# Patient Record
Sex: Male | Born: 1990 | Race: White | Hispanic: No | Marital: Single | Smoking: Current every day smoker
Health system: Southern US, Community
[De-identification: ages and names within clinical notes are randomized; demographics above are authoritative.]

---
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
## 2011-11-15 ENCOUNTER — Emergency Department: Payer: Self-pay | Admitting: Emergency Medicine

## 2013-04-24 ENCOUNTER — Emergency Department (HOSPITAL_COMMUNITY)
Admission: EM | Admit: 2013-04-24 | Discharge: 2013-04-24 | Disposition: A | Discharge status: Home / Self Care | Payer: Self-pay | Attending: Emergency Medicine | Admitting: Emergency Medicine

## 2013-04-24 ENCOUNTER — Encounter (HOSPITAL_COMMUNITY): Payer: Self-pay | Admitting: Cardiology

## 2013-04-24 ENCOUNTER — Emergency Department (HOSPITAL_COMMUNITY): Payer: Self-pay

## 2013-04-24 DIAGNOSIS — F17200 Nicotine dependence, unspecified, uncomplicated: Secondary | ICD-10-CM | POA: Insufficient documentation

## 2013-04-24 DIAGNOSIS — Y92009 Unspecified place in unspecified non-institutional (private) residence as the place of occurrence of the external cause: Secondary | ICD-10-CM | POA: Insufficient documentation

## 2013-04-24 DIAGNOSIS — S60222A Contusion of left hand, initial encounter: Secondary | ICD-10-CM

## 2013-04-24 DIAGNOSIS — S60229A Contusion of unspecified hand, initial encounter: Secondary | ICD-10-CM | POA: Insufficient documentation

## 2013-04-24 DIAGNOSIS — Y9389 Activity, other specified: Secondary | ICD-10-CM | POA: Insufficient documentation

## 2013-04-24 DIAGNOSIS — W2209XA Striking against other stationary object, initial encounter: Secondary | ICD-10-CM | POA: Insufficient documentation

## 2013-04-24 DIAGNOSIS — S60221A Contusion of right hand, initial encounter: Secondary | ICD-10-CM

## 2013-04-24 MED ORDER — HYDROCODONE-ACETAMINOPHEN 5-325 MG PO TABS
1.0000 | ORAL_TABLET | Freq: Once | ORAL | Status: AC
  Administered 2013-04-24: 1 via ORAL
  Filled 2013-04-24: qty 1

## 2013-04-24 MED ORDER — IBUPROFEN 400 MG PO TABS
800.0000 mg | ORAL_TABLET | Freq: Once | ORAL | Status: AC
  Administered 2013-04-24: 800 mg via ORAL
  Filled 2013-04-24: qty 2

## 2013-04-24 MED ORDER — HYDROCODONE-ACETAMINOPHEN 5-325 MG PO TABS: 1.0000 | ORAL_TABLET | ORAL | Status: DC | PRN

## 2013-04-24 MED ORDER — IBUPROFEN 600 MG PO TABS: 600.0000 mg | ORAL_TABLET | Freq: Four times a day (QID) | ORAL | Status: DC | PRN
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
## 2013-04-24 NOTE — ED Notes (Signed)
PT ambulated with baseline gait; VSS; A&Ox3; no signs of distress; respirations even and unlabored; skin warm and dry; no questions upon discharge.  

## 2013-04-24 NOTE — ED Notes (Signed)
Pt eatting Doritos in the waiting room. PT instructed to stop eatting and not to drink anything at this time.

## 2013-04-24 NOTE — ED Notes (Signed)
Patient transported to X-ray 

## 2013-04-24 NOTE — ED Provider Notes (Signed)
Medical screening examination/treatment/procedure(s) were performed by non-physician practitioner and as supervising physician I was immediately available for consultation/collaboration.   Charles B. Sheldon, MD 04/24/13 1524 

## 2013-04-24 NOTE — ED Notes (Signed)
Pt returned from xray
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
## 2013-05-26 ENCOUNTER — Emergency Department: Payer: Self-pay | Admitting: Emergency Medicine

## 2018-03-21 ENCOUNTER — Emergency Department
Admission: EM | Admit: 2018-03-21 | Discharge: 2018-03-21 | Disposition: A | Discharge status: Home / Self Care | Payer: Self-pay | Attending: Emergency Medicine | Admitting: Emergency Medicine

## 2018-03-21 ENCOUNTER — Encounter: Payer: Self-pay | Admitting: Emergency Medicine

## 2018-03-21 ENCOUNTER — Emergency Department: Payer: Self-pay

## 2018-03-21 ENCOUNTER — Other Ambulatory Visit: Payer: Self-pay

## 2018-03-21 DIAGNOSIS — M79671 Pain in right foot: Secondary | ICD-10-CM | POA: Insufficient documentation

## 2018-03-21 DIAGNOSIS — F17210 Nicotine dependence, cigarettes, uncomplicated: Secondary | ICD-10-CM | POA: Insufficient documentation

## 2018-03-21 MED ORDER — MELOXICAM 15 MG PO TABS: 15.0000 mg | ORAL_TABLET | Freq: Every day | ORAL | 1 refills | Status: AC
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
## 2018-05-05 ENCOUNTER — Encounter: Payer: Self-pay | Admitting: Emergency Medicine

## 2018-05-05 ENCOUNTER — Other Ambulatory Visit: Payer: Self-pay

## 2018-05-05 ENCOUNTER — Emergency Department
Admission: EM | Admit: 2018-05-05 | Discharge: 2018-05-05 | Discharge status: Against Medical Advice | Payer: Self-pay | Attending: Emergency Medicine | Admitting: Emergency Medicine

## 2018-05-05 DIAGNOSIS — R2681 Unsteadiness on feet: Secondary | ICD-10-CM | POA: Insufficient documentation

## 2018-05-05 DIAGNOSIS — Z5321 Procedure and treatment not carried out due to patient leaving prior to being seen by health care provider: Secondary | ICD-10-CM | POA: Insufficient documentation

## 2018-05-05 DIAGNOSIS — Z0283 Encounter for blood-alcohol and blood-drug test: Secondary | ICD-10-CM | POA: Insufficient documentation
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
## 2018-05-05 NOTE — ED Notes (Signed)
Patient refusing to go to a bed in hallway to await ride.  Explained to patient concern for his safety if he is in lobby or walks outside.  Patient continues to refuse offer of bed to lay down in.
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
## 2018-05-05 NOTE — ED Notes (Signed)
Informed by charge nurse that patient had been picked up by his mother.

## 2018-08-25 ENCOUNTER — Other Ambulatory Visit: Payer: Self-pay

## 2018-08-25 ENCOUNTER — Emergency Department
Admission: EM | Admit: 2018-08-25 | Discharge: 2018-08-25 | Disposition: A | Discharge status: Home / Self Care | Payer: Self-pay | Attending: Emergency Medicine | Admitting: Emergency Medicine

## 2018-08-25 ENCOUNTER — Encounter: Payer: Self-pay | Admitting: Emergency Medicine

## 2018-08-25 DIAGNOSIS — R197 Diarrhea, unspecified: Secondary | ICD-10-CM | POA: Insufficient documentation

## 2018-08-25 DIAGNOSIS — F17210 Nicotine dependence, cigarettes, uncomplicated: Secondary | ICD-10-CM | POA: Insufficient documentation

## 2018-08-25 DIAGNOSIS — B349 Viral infection, unspecified: Secondary | ICD-10-CM | POA: Insufficient documentation

## 2018-08-25 LAB — COMPREHENSIVE METABOLIC PANEL
ALBUMIN: 4.5 g/dL (ref 3.5–5.0)
ALK PHOS: 45 U/L (ref 38–126)
ALT: 26 U/L (ref 0–44)
ANION GAP: 10 (ref 5–15)
AST: 27 U/L (ref 15–41)
BUN: 17 mg/dL (ref 6–20)
CALCIUM: 9.6 mg/dL (ref 8.9–10.3)
CO2: 27 mmol/L (ref 22–32)
Chloride: 99 mmol/L (ref 98–111)
Creatinine, Ser: 0.96 mg/dL (ref 0.61–1.24)
GFR calc non Af Amer: >60 mL/min (ref >60)
GLUCOSE: 121 mg/dL — AB (ref 70–99)
POTASSIUM: 3.5 mmol/L (ref 3.5–5.1)
SODIUM: 136 mmol/L (ref 135–145)
Total Bilirubin: 1.1 mg/dL (ref 0.3–1.2)
Total Protein: 7.8 g/dL (ref 6.5–8.1)

## 2018-08-25 LAB — URINALYSIS, COMPLETE (UACMP) WITH MICROSCOPIC
Bacteria, UA: NONE SEEN
Bilirubin Urine: NEGATIVE
GLUCOSE, UA: NEGATIVE mg/dL
Hgb urine dipstick: NEGATIVE
KETONES UR: NEGATIVE mg/dL
LEUKOCYTES UA: NEGATIVE
Nitrite: NEGATIVE
PH: 6 (ref 5.0–8.0)
PROTEIN: NEGATIVE mg/dL
Specific Gravity, Urine: 1.008 (ref 1.005–1.030)

## 2018-08-25 LAB — CBC
HCT: 47.1 % (ref 39.0–52.0)
HEMOGLOBIN: 15.6 g/dL (ref 13.0–17.0)
MCH: 31.6 pg (ref 26.0–34.0)
MCHC: 33.1 g/dL (ref 30.0–36.0)
MCV: 95.3 fL (ref 80.0–100.0)
NRBC: 0 % (ref 0.0–0.2)
PLATELETS: 287 10*3/uL (ref 150–400)
RBC: 4.94 MIL/uL (ref 4.22–5.81)
RDW: 12.2 % (ref 11.5–15.5)
WBC: 8.1 10*3/uL (ref 4.0–10.5)

## 2018-08-25 LAB — LIPASE, BLOOD: LIPASE: 31 U/L (ref 11–51)

## 2018-08-25 MED ORDER — SODIUM CHLORIDE 0.9 % IV BOLUS
1000.0000 mL | Freq: Once | INTRAVENOUS | Status: AC
  Administered 2018-08-25: 1000 mL via INTRAVENOUS

## 2018-08-25 MED ORDER — ONDANSETRON HCL 4 MG PO TABS: 4.0000 mg | ORAL_TABLET | Freq: Three times a day (TID) | ORAL | 0 refills | Status: AC | PRN

## 2018-08-25 MED ORDER — ONDANSETRON HCL 4 MG/2ML IJ SOLN
4.0000 mg | Freq: Once | INTRAMUSCULAR | Status: AC
  Administered 2018-08-25: 4 mg via INTRAVENOUS
  Filled 2018-08-25: qty 2

## 2018-08-25 NOTE — ED Triage Notes (Signed)
Pt to ED via POV c/o abd pain and loss of appetite. Pt states that he has also been having episodes of sweating. Pt has had symptoms since Friday. Pt is in NAD at this time.

## 2018-08-25 NOTE — ED Provider Notes (Addendum)
[facility] Emergency Department Provider Note  ____________________________________________   I have reviewed the triage vital signs and the nursing notes. Where available I have reviewed prior notes and, if possible and indicated, outside hospital notes.    HISTORY  Chief Complaint Abdominal Pain    HPI Edgar Ruiz is a 27 y.o. male who presents today complaining of runny nose, cough, sore throat, diarrhea, nausea, as well as some mild abdominal cramping, all started on Friday, 2 days ago.  No documented fevers.  No history of alcohol withdrawal or abuse, no chest pain no shortness of breath, breathing well but does have a slight nonproductive cough.  Positive sick contacts with similar. Bowel movements are nonbloody, patient has had no focal abdominal discomfort just diffuse cramping abdominal pain associated with copious watery diarrhea.  History reviewed. No pertinent past medical history.  There are no active problems to display for this patient.   History reviewed. No pertinent surgical history.  Prior to Admission medications   Not on File    Allergies Patient has no known allergies.  No family history on file.  Social History Social History   Tobacco Use  . Smoking status: Current Every Day Smoker    Types: Cigarettes  . Smokeless tobacco: Never Used  Substance Use Topics  . Alcohol use: Yes  . Drug use: No    Review of Systems Constitutional: Subjective fever Eyes: No visual changes. ENT: + sore throat. No stiff neck no neck pain Cardiovascular: Denies chest pain. Respiratory: Denies shortness of breath.  Cough gastrointestinal:   no vomiting.  + diarrhea.  No constipation. Genitourinary: Negative for dysuria. Musculoskeletal: Negative lower extremity swelling Skin: Negative for rash. Neurological: Negative for severe headaches, focal weakness or numbness.   ____________________________________________   PHYSICAL  EXAM:  VITAL SIGNS: ED Triage Vitals  Enc Vitals Group     BP 08/25/18 1502 140/86     Pulse Rate 08/25/18 1502 74     Resp 08/25/18 1502 12     Temp 08/25/18 1502 98.3 F (36.8 C)     Temp Source 08/25/18 1502 Oral     SpO2 08/25/18 1502 99 %     Weight 08/25/18 1502 140 lb (63.5 kg)     Height 08/25/18 1502 5\' 11"  (1.803 m)     Head Circumference --      Peak Flow --      Pain Score 08/25/18 1507 5     Pain Loc --      Pain Edu? --      Excl. in GC? --     Constitutional: Alert and oriented. Well appearing and in no acute distress. Eyes: Conjunctivae are normal Head: Atraumatic HEENT: + congestion/rhinnorhea. Mucous membranes are moist.  Oropharynx non-erythematous very slight cobblestoning noted Neck:   Nontender with no meningismus, no masses, no stridor Cardiovascular: Normal rate, regular rhythm. Grossly normal heart sounds.  Good peripheral circulation. Respiratory: Normal respiratory effort.  No retractions. Lungs CTAB. Abdominal: Soft and very slight diffuse mild tenderness with no focality no surgical signs. No distention. No guarding no rebound Back:  There is no focal tenderness or step off.  there is no midline tenderness there are no lesions noted. there is no CVA tenderness Musculoskeletal: No lower extremity tenderness, no upper extremity tenderness. No joint effusions, no DVT signs strong distal pulses no edema Neurologic:  Normal speech and language. No gross focal neurologic deficits are appreciated.  Skin:  Skin is warm, dry and intact. No  rash noted. Psychiatric: Mood and affect are normal. Speech and behavior are normal.  ____________________________________________   LABS (all labs ordered are listed, but only abnormal results are displayed)  Labs Reviewed  COMPREHENSIVE METABOLIC PANEL - Abnormal; Notable for the following components:      Result Value   Glucose, Bld 121 (*)    All other components within normal limits  LIPASE, BLOOD  CBC   URINALYSIS, COMPLETE (UACMP) WITH MICROSCOPIC    Pertinent labs  results that were available during my care of the patient were reviewed by me and considered in my medical decision making (see chart for details). ____________________________________________  EKG  I personally interpreted any EKGs ordered by me or triage  ____________________________________________  RADIOLOGY  Pertinent labs & imaging results that were available during my care of the patient were reviewed by me and considered in my medical decision making (see chart for details). If possible, patient and/or family made aware of any abnormal findings.  No results found. ____________________________________________    PROCEDURES  Procedure(s) performed: None  Procedures  Critical Care performed: None  ____________________________________________   INITIAL IMPRESSION / ASSESSMENT AND PLAN / ED COURSE  Pertinent labs & imaging results that were available during my care of the patient were reviewed by me and considered in my medical decision making (see chart for details).  Patient here with runny nose, sore throat, nausea, diarrhea, abdominal cramping, and generally feeling somewhat unwell.  Abdomen is nonsurgical, despite 2 days of symptoms.  Vital signs are reassuring, we will give him IV fluids and antiemetics.  This is most likely a viral syndrome.  Influenza is possible but more likely this is an influenza-like syndrome.  In young healthy man I do not think there is any strong utility in checking flu swabs.  We will see how he feels after supportive care including IV fluids and antiemetics.  Serial abdominal exams as far quite benign.  ----------------------------------------- 5:18 PM on 08/25/2018 -----------------------------------------  Patient feeling better, abdominal exams remain benign we will see if he can tolerate p.o.  ----------------------------------------- 6:39 PM on  08/25/2018 -----------------------------------------  tol po. Feeling better looking good will d.c. States hr normally low. No evidence of myocarditis etc. Pt requesting work note for last 2 days.    ____________________________________________   FINAL CLINICAL IMPRESSION(S) / ED DIAGNOSES  Final diagnoses:  None      This chart was dictated using voice recognition software.  Despite best efforts to proofread,  errors can occur which can change meaning.      Jeanmarie Plant, MD 08/25/18 1636    Jeanmarie Plant, MD 08/25/18 1718    Jeanmarie Plant, MD 08/25/18 [phone]
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
